# Patient Record
Sex: Female | Born: 1980 | Hispanic: Yes | Marital: Married
Health system: Southern US, Community
[De-identification: ages and names within clinical notes are randomized; demographics above are authoritative.]

## PROBLEM LIST (undated history)

## (undated) DIAGNOSIS — J45909 Unspecified asthma, uncomplicated: Secondary | ICD-10-CM

## (undated) HISTORY — PX: APPENDECTOMY: SHX54

## (undated) HISTORY — PX: TONSILLECTOMY: SUR1361

---

## 2019-04-20 ENCOUNTER — Emergency Department (HOSPITAL_COMMUNITY)
Admission: EM | Admit: 2019-04-20 | Discharge: 2019-04-21 | Disposition: A | Discharge status: Home / Self Care | Payer: Self-pay | Attending: Emergency Medicine | Admitting: Emergency Medicine

## 2019-04-20 ENCOUNTER — Emergency Department (HOSPITAL_COMMUNITY): Payer: Self-pay

## 2019-04-20 ENCOUNTER — Other Ambulatory Visit: Payer: Self-pay

## 2019-04-20 ENCOUNTER — Encounter (HOSPITAL_COMMUNITY): Payer: Self-pay | Admitting: Emergency Medicine

## 2019-04-20 DIAGNOSIS — R609 Edema, unspecified: Secondary | ICD-10-CM | POA: Insufficient documentation

## 2019-04-20 DIAGNOSIS — R0602 Shortness of breath: Secondary | ICD-10-CM | POA: Insufficient documentation

## 2019-04-20 DIAGNOSIS — R002 Palpitations: Secondary | ICD-10-CM | POA: Insufficient documentation

## 2019-04-20 DIAGNOSIS — J45909 Unspecified asthma, uncomplicated: Secondary | ICD-10-CM | POA: Insufficient documentation

## 2019-04-20 HISTORY — DX: Unspecified asthma, uncomplicated: J45.909

## 2019-04-20 LAB — COMPREHENSIVE METABOLIC PANEL
ALT: 24 U/L (ref 0–44)
AST: 22 U/L (ref 15–41)
Albumin: 3.7 g/dL (ref 3.5–5.0)
Alkaline Phosphatase: 83 U/L (ref 38–126)
Anion gap: 6 (ref 5–15)
BUN: 16 mg/dL (ref 6–20)
CO2: 23 mmol/L (ref 22–32)
Calcium: 8.9 mg/dL (ref 8.9–10.3)
Chloride: 110 mmol/L (ref 98–111)
Creatinine, Ser: 0.41 mg/dL — ABNORMAL LOW (ref 0.44–1.00)
GFR calc Af Amer: >60 mL/min (ref >60)
GFR calc non Af Amer: >60 mL/min (ref >60)
Glucose, Bld: 95 mg/dL (ref 70–99)
Potassium: 3.9 mmol/L (ref 3.5–5.1)
Sodium: 139 mmol/L (ref 135–145)
Total Bilirubin: 0.5 mg/dL (ref 0.3–1.2)
Total Protein: 6.7 g/dL (ref 6.5–8.1)

## 2019-04-20 LAB — CBC WITH DIFFERENTIAL/PLATELET
Abs Immature Granulocytes: 0.03 10*3/uL (ref 0.00–0.07)
Basophils Absolute: 0 10*3/uL (ref 0.0–0.1)
Basophils Relative: 0 %
Eosinophils Absolute: 0.1 10*3/uL (ref 0.0–0.5)
Eosinophils Relative: 1 %
HCT: 32.9 % — ABNORMAL LOW (ref 36.0–46.0)
Hemoglobin: 10.3 g/dL — ABNORMAL LOW (ref 12.0–15.0)
Immature Granulocytes: 0 %
Lymphocytes Relative: 23 %
Lymphs Abs: 2.4 10*3/uL (ref 0.7–4.0)
MCH: 26.6 pg (ref 26.0–34.0)
MCHC: 31.3 g/dL (ref 30.0–36.0)
MCV: 85 fL (ref 80.0–100.0)
Monocytes Absolute: 1 10*3/uL (ref 0.1–1.0)
Monocytes Relative: 9 %
Neutro Abs: 7.3 10*3/uL (ref 1.7–7.7)
Neutrophils Relative %: 67 %
Platelets: 242 10*3/uL (ref 150–400)
RBC: 3.87 MIL/uL (ref 3.87–5.11)
RDW: 14.6 % (ref 11.5–15.5)
WBC: 10.8 10*3/uL — ABNORMAL HIGH (ref 4.0–10.5)
nRBC: 0 % (ref 0.0–0.2)

## 2019-04-20 LAB — TSH: TSH: <0.01 u[IU]/mL — ABNORMAL LOW (ref 0.350–4.500)

## 2019-04-20 LAB — BRAIN NATRIURETIC PEPTIDE: B Natriuretic Peptide: 308.6 pg/mL — ABNORMAL HIGH (ref 0.0–100.0)

## 2019-04-20 MED ORDER — SODIUM CHLORIDE 0.9 % IV BOLUS: 1000.0000 mL | Freq: Once | INTRAVENOUS | Status: DC

## 2019-04-20 NOTE — ED Triage Notes (Signed)
Used spanish interpretor Ericka# [id number].  Swelling in legs and ankles, tachycardia and was sent to ED from PCP. Pt reports saw her PC was in [place] over a month ago. Pt also fell 2 days ago and has bruise on left knee.

## 2019-04-20 NOTE — ED Notes (Signed)
Refuses resp nasal swab states had nose surgery 5 yrs ago - and does not want

## 2019-04-20 NOTE — ED Provider Notes (Signed)
[facility]-EMERGENCY DEPT [facility] Emergency Department Provider Note MRN:  [id number]  Arrival date & time: 04/20/19     Chief Complaint   Leg Swelling and Tachycardia   History of Present Illness   Patty Ball is a 39 y.o. year-old female with a history of asthma presenting to the ED with chief complaint of leg swelling and tachycardia.  10 days of leg swelling, palpitations, mild dyspnea on exertion.  Denies chest pain, no headache, no vomiting, no abdominal pain.  No dysuria, no fever, no cough.  Symptoms constant, no exacerbating or alleviating factors.  History obtained using a [place] interpreter.  Review of Systems  A complete 10 system review of systems was obtained and all systems are negative except as noted in the HPI and PMH.   Patient's Health History    Past Medical History:  Diagnosis Date  . Asthma     Past Surgical History:  Procedure Laterality Date  . APPENDECTOMY    . TONSILLECTOMY      No family history on file.  Social History   Socioeconomic History  . Marital status: Married    Spouse name: Not on file  . Number of children: Not on file  . Years of education: Not on file  . Highest education level: Not on file  Occupational History  . Not on file  Tobacco Use  . Smoking status: Not on file  Substance and Sexual Activity  . Alcohol use: Not on file  . Drug use: Not on file  . Sexual activity: Not on file  Other Topics Concern  . Not on file  Social History Narrative  . Not on file   Social Determinants of Health   Financial Resource Strain:   . Difficulty of Paying Living Expenses: Not on file  Food Insecurity:   . Worried About Programme researcher, broadcasting/film/video in the Last Year: Not on file  . Ran Out of Food in the Last Year: Not on file  Transportation Needs:   . Lack of Transportation (Medical): Not on file  . Lack of Transportation (Non-Medical): Not on file  Physical Activity:   . Days of Exercise per Week: Not on file  .  Minutes of Exercise per Session: Not on file  Stress:   . Feeling of Stress : Not on file  Social Connections:   . Frequency of Communication with Friends and Family: Not on file  . Frequency of Social Gatherings with Friends and Family: Not on file  . Attends Religious Services: Not on file  . Active Member of Clubs or Organizations: Not on file  . Attends Banker Meetings: Not on file  . Marital Status: Not on file  Intimate Partner Violence:   . Fear of Current or Ex-Partner: Not on file  . Emotionally Abused: Not on file  . Physically Abused: Not on file  . Sexually Abused: Not on file     Physical Exam  Vital Signs and Nursing Notes reviewed Vitals:   04/20/19 2006 04/20/19 2230  BP: (!) 152/56 (!) 137/58  Pulse: (!) 126 (!) 107  Resp: 18 18  Temp:    SpO2: 99% 100%    CONSTITUTIONAL: Well-appearing, NAD NEURO:  Alert and oriented x 3, no focal deficits EYES:  eyes equal and reactive ENT/NECK:  no LAD, no JVD CARDIO: Tachycardic rate, well-perfused, normal S1 and S2 PULM:  CTAB no wheezing or rhonchi GI/GU:  normal bowel sounds, non-distended, non-tender MSK/SPINE:  No gross deformities, no  edema SKIN:  no rash, atraumatic PSYCH:  Appropriate speech and behavior  Diagnostic and Interventional Summary    EKG Interpretation  Date/Time:    Ventricular Rate:    PR Interval:    QRS Duration:   QT Interval:    QTC Calculation:   R Axis:     Text Interpretation:        Labs Reviewed  CBC WITH DIFFERENTIAL/PLATELET - Abnormal; Notable for the following components:      Result Value   WBC 10.8 (*)    Hemoglobin 10.3 (*)    HCT 32.9 (*)    All other components within normal limits  COMPREHENSIVE METABOLIC PANEL - Abnormal; Notable for the following components:   Creatinine, Ser 0.41 (*)    All other components within normal limits  BRAIN NATRIURETIC PEPTIDE - Abnormal; Notable for the following components:   B Natriuretic Peptide 308.6 (*)     All other components within normal limits  TSH - Abnormal; Notable for the following components:   TSH <0.010 (*)    All other components within normal limits  RESPIRATORY PANEL BY RT PCR (FLU A&B, COVID)  T4, FREE  T3, FREE  THYROTROPIN RECEPTOR AUTOABS  I-STAT BETA HCG BLOOD, ED ([facility], [facility], [facility] ONLY)  TROPONIN I (HIGH SENSITIVITY)    DG Knee Complete 4 Views Left  Final Result    DG Chest 1 View  Final Result    CTA Chest for PE    (Results Pending)    Medications - No data to display   Procedures  /  Critical Care Procedures  ED Course and Medical Decision Making  I have reviewed the triage vital signs, the nursing notes, and pertinent available records from the EMR.  Pertinent labs & imaging results that were available during my care of the patient were reviewed by me and considered in my medical decision making (see below for details).     Considering PE, heart failure, hypothyroidism, COVID-19, work-up pending.  Awaiting labs, CTA chest.  Without PE and with resolution of tachycardia, patient is likely appropriate for discharge.  If labs confirm hyperthyroidism, will consider internal medicine or hospitalist consultation with regard to outpatient medications.  Signed out to oncoming provider at shift change.  Barth Kirks. Sedonia Small, [place] [email or web address]  Final Clinical Impressions(s) / ED Diagnoses     ICD-10-CM   1. Palpitations  R00.2   2. Peripheral edema  R60.9     ED Discharge Orders    None       Discharge Instructions Discussed with and Provided to Patient:   Discharge Instructions   None       Maudie Flakes, MD 04/20/19 2259

## 2019-04-21 ENCOUNTER — Encounter (HOSPITAL_COMMUNITY): Payer: Self-pay

## 2019-04-21 ENCOUNTER — Emergency Department (HOSPITAL_COMMUNITY): Payer: Self-pay

## 2019-04-21 LAB — I-STAT BETA HCG BLOOD, ED (MC, WL, AP ONLY): I-stat hCG, quantitative: <5 m[IU]/mL (ref <5)

## 2019-04-21 LAB — T4, FREE: Free T4: 5.15 ng/dL — ABNORMAL HIGH (ref 0.61–1.12)

## 2019-04-21 LAB — TROPONIN I (HIGH SENSITIVITY): Troponin I (High Sensitivity): 3 ng/L (ref <18)

## 2019-04-21 MED ORDER — IOHEXOL 350 MG/ML SOLN
100.0000 mL | Freq: Once | INTRAVENOUS | Status: AC | PRN
  Administered 2019-04-21: 100 mL via INTRAVENOUS

## 2019-04-21 MED ORDER — SODIUM CHLORIDE (PF) 0.9 % IJ SOLN
INTRAMUSCULAR | Status: AC
  Filled 2019-04-21: qty 50

## 2019-04-21 MED ORDER — METOPROLOL TARTRATE 25 MG PO TABS
25.0000 mg | ORAL_TABLET | Freq: Once | ORAL | Status: AC
  Administered 2019-04-21: 25 mg via ORAL
  Filled 2019-04-21: qty 1

## 2019-04-21 MED ORDER — SODIUM CHLORIDE 0.9 % IV BOLUS (SEPSIS)
2000.0000 mL | Freq: Once | INTRAVENOUS | Status: AC
  Administered 2019-04-21: 2000 mL via INTRAVENOUS

## 2019-04-21 MED ORDER — METOPROLOL TARTRATE 25 MG PO TABS: 25.0000 mg | ORAL_TABLET | Freq: Two times a day (BID) | ORAL | 0 refills | Status: AC

## 2019-04-21 NOTE — ED Provider Notes (Signed)
Interpreter # [phone] Pt reports feeling palpitations a week ago  No active CP No syncope She reports she has been told her "thyroid is low" Will start lopressor Refer to PCP CT chest negative Patient overall well-appearing.  Heart rate is coming down to 120 Refused Covid testing   EKG Interpretation  Date/Time:  Sunday April 21 2019 00:13:37 EST Ventricular Rate:  123 PR Interval:    QRS Duration: 92 QT Interval:  323 QTC Calculation: 462 R Axis:   71 Text Interpretation: Sinus tachycardia Minimal ST depression, lateral leads Confirmed by Zadie Rhine ([id number]) on 04/21/2019 12:21:34 AM      We will start Lopressor Suspect she does have component of hypothyroid causing her symptoms.  I have requested social work to assist with close PCP follow-up as this patient just relocated here from [place] she will likely need treatment for hypothyroid.  At this time no signs of thyroid storm, no acute distress noted She will complete IV fluids and be given Lopressor prior to discharge    Zadie Rhine, MD 04/21/19 [phone]

## 2019-04-22 ENCOUNTER — Telehealth: Payer: Self-pay | Admitting: *Deleted

## 2019-04-22 LAB — T3, FREE: T3, Free: 23.3 pg/mL — ABNORMAL HIGH (ref 2.0–4.4)

## 2019-04-22 NOTE — Telephone Encounter (Signed)
TOC CM contacted pt to speak to her and states she only speaks [place]. Contacted [facility] and scheduled appt for 05/14/2019 at 9:30 am. Office receptionist speaks Spanish and will call and give pt her appt time. Will have Spanish interpreter call to follow up on medications and ED visit. ED provided updated on referral. Isidoro Donning RN CCM, [facility] ED TOC CM [phone]

## 2019-05-14 ENCOUNTER — Inpatient Hospital Stay (INDEPENDENT_AMBULATORY_CARE_PROVIDER_SITE_OTHER): Payer: Self-pay | Admitting: Primary Care

## 2020-09-30 ENCOUNTER — Emergency Department (HOSPITAL_COMMUNITY): Payer: 59

## 2020-09-30 ENCOUNTER — Encounter (HOSPITAL_COMMUNITY): Payer: Self-pay | Admitting: *Deleted

## 2020-09-30 ENCOUNTER — Other Ambulatory Visit: Payer: Self-pay

## 2020-09-30 ENCOUNTER — Emergency Department (HOSPITAL_COMMUNITY)
Admission: EM | Admit: 2020-09-30 | Discharge: 2020-09-30 | Disposition: A | Discharge status: Home / Self Care | Payer: 59 | Attending: Emergency Medicine | Admitting: Emergency Medicine

## 2020-09-30 DIAGNOSIS — R072 Precordial pain: Secondary | ICD-10-CM | POA: Diagnosis not present

## 2020-09-30 DIAGNOSIS — R0602 Shortness of breath: Secondary | ICD-10-CM | POA: Insufficient documentation

## 2020-09-30 DIAGNOSIS — R0789 Other chest pain: Secondary | ICD-10-CM

## 2020-09-30 DIAGNOSIS — N926 Irregular menstruation, unspecified: Secondary | ICD-10-CM | POA: Insufficient documentation

## 2020-09-30 LAB — BASIC METABOLIC PANEL
Anion gap: 8 (ref 5–15)
BUN: 13 mg/dL (ref 6–20)
CO2: 27 mmol/L (ref 22–32)
Calcium: 9.5 mg/dL (ref 8.9–10.3)
Chloride: 103 mmol/L (ref 98–111)
Creatinine, Ser: 0.69 mg/dL (ref 0.44–1.00)
GFR, Estimated: >60 mL/min (ref >60)
Glucose, Bld: 86 mg/dL (ref 70–99)
Potassium: 3.7 mmol/L (ref 3.5–5.1)
Sodium: 138 mmol/L (ref 135–145)

## 2020-09-30 LAB — CBC WITH DIFFERENTIAL/PLATELET
Abs Immature Granulocytes: 0.02 10*3/uL (ref 0.00–0.07)
Basophils Absolute: 0 10*3/uL (ref 0.0–0.1)
Basophils Relative: 0 %
Eosinophils Absolute: 0.1 10*3/uL (ref 0.0–0.5)
Eosinophils Relative: 1 %
HCT: 39.6 % (ref 36.0–46.0)
Hemoglobin: 12.9 g/dL (ref 12.0–15.0)
Immature Granulocytes: 0 %
Lymphocytes Relative: 24 %
Lymphs Abs: 2.2 10*3/uL (ref 0.7–4.0)
MCH: 28 pg (ref 26.0–34.0)
MCHC: 32.6 g/dL (ref 30.0–36.0)
MCV: 85.9 fL (ref 80.0–100.0)
Monocytes Absolute: 0.8 10*3/uL (ref 0.1–1.0)
Monocytes Relative: 9 %
Neutro Abs: 5.8 10*3/uL (ref 1.7–7.7)
Neutrophils Relative %: 66 %
Platelets: 323 10*3/uL (ref 150–400)
RBC: 4.61 MIL/uL (ref 3.87–5.11)
RDW: 15.2 % (ref 11.5–15.5)
WBC: 8.9 10*3/uL (ref 4.0–10.5)
nRBC: 0 % (ref 0.0–0.2)

## 2020-09-30 LAB — TYPE AND SCREEN
ABO/RH(D): A POS
Antibody Screen: NEGATIVE

## 2020-09-30 LAB — TROPONIN I (HIGH SENSITIVITY)
Troponin I (High Sensitivity): <2 ng/L (ref <18)
Troponin I (High Sensitivity): <2 ng/L (ref <18)

## 2020-09-30 LAB — I-STAT BETA HCG BLOOD, ED (MC, WL, AP ONLY): I-stat hCG, quantitative: <5 m[IU]/mL (ref <5)

## 2020-09-30 LAB — PROTIME-INR
INR: 1 (ref 0.8–1.2)
Prothrombin Time: 13.5 seconds (ref 11.4–15.2)

## 2020-09-30 NOTE — ED Triage Notes (Signed)
Pt speaks spanish, interpreter used. Pt complains of dizziness back pain, chest pain and shortness of breath. She states she has been on her period for the past 20 days. She reports bleeding is heavy.

## 2020-09-30 NOTE — Discharge Instructions (Addendum)
Return for any problem.  Follow up closely with your regular care provider in [place] when you return home.

## 2020-09-30 NOTE — ED Provider Notes (Signed)
[facility]  HOSPITAL-EMERGENCY DEPT Provider Note   CSN: [id number] Arrival date & time: 09/30/20  6644     History Chief Complaint  Patient presents with   Chest Pain   Shortness of Breath    Patty Ball is a 40 y.o. female.  40 year old female with prior medical history as detailed below presents for evaluation.  Patient reports onset of chest discomfort this morning.  Lasted approximately 30 minutes.  Patient's describes the discomfort as pressure across her entire upper chest.  She also complains of persistent vaginal bleeding ongoing for the last 3 weeks.  She reports that her typical period last approximately 3 days.  Chest discomfort is now resolved.   She is visiting here from [place].  She plans on returning home on July 8.  She denies prior history of cardiac disease.  She denies recent fever or other complaint.  The history is provided by the patient and medical records. A language interpreter was used.  Chest Pain Pain location:  Substernal area Pain quality: aching   Pain radiates to:  Does not radiate Pain severity:  No pain Onset quality:  Sudden Duration:  30 minutes Timing:  Rare Progression:  Resolved Chronicity:  New Relieved by:  Nothing Associated symptoms: shortness of breath   Shortness of Breath Associated symptoms: chest pain       History reviewed. No pertinent past medical history.  There are no problems to display for this patient.   Past Surgical History:  Procedure Laterality Date   APPENDECTOMY       OB History   No obstetric history on file.     No family history on file.     Home Medications Prior to Admission medications   Not on File    Allergies    Hydrocortisone, Penicillins, and Vitamin k and related  Review of Systems   Review of Systems  Respiratory:  Positive for shortness of breath.   Cardiovascular:  Positive for chest pain.   Physical Exam Updated Vital Signs BP 131/70   Pulse  63   Temp 98.1 F (36.7 C) (Oral)   Resp 18   LMP 09/09/2020   SpO2 97%   Physical Exam Vitals and nursing note reviewed.  Constitutional:      General: She is not in acute distress.    Appearance: Normal appearance. She is well-developed.  HENT:     Head: Normocephalic and atraumatic.  Eyes:     Conjunctiva/sclera: Conjunctivae normal.     Pupils: Pupils are equal, round, and reactive to light.  Cardiovascular:     Rate and Rhythm: Normal rate and regular rhythm.     Heart sounds: Normal heart sounds.  Pulmonary:     Effort: Pulmonary effort is normal. No respiratory distress.     Breath sounds: Normal breath sounds.  Abdominal:     General: There is no distension.     Palpations: Abdomen is soft.     Tenderness: There is no abdominal tenderness.  Musculoskeletal:        General: No deformity. Normal range of motion.     Cervical back: Normal range of motion and neck supple.  Skin:    General: Skin is warm and dry.  Neurological:     General: No focal deficit present.     Mental Status: She is alert and oriented to person, place, and time.    ED Results / Procedures / Treatments   Labs (all labs ordered are listed, but  only abnormal results are displayed) Labs Reviewed  PROTIME-INR  BASIC METABOLIC PANEL  CBC WITH DIFFERENTIAL/PLATELET  I-STAT BETA HCG BLOOD, ED ([facility], [facility], [facility] ONLY)  TYPE AND SCREEN  ABO/RH  TROPONIN I (HIGH SENSITIVITY)  TROPONIN I (HIGH SENSITIVITY)    EKG EKG Interpretation  Date/Time:  Wednesday September 30 2020 10:06:47 EDT Ventricular Rate:  55 PR Interval:  146 QRS Duration: 102 QT Interval:  433 QTC Calculation: 415 R Axis:   73 Text Interpretation: Sinus rhythm RSR' in V1 or V2, probably normal variant Confirmed by Kristine Royal [phone]) on 09/30/2020 12:22:31 PM  Radiology DG Chest 2 View  Result Date: 09/30/2020 CLINICAL DATA:  Chest pain EXAM: CHEST - 2 VIEW COMPARISON:  None. FINDINGS: Artifact from EKG leads. Normal heart size  and mediastinal contours. No acute infiltrate or edema. No effusion or pneumothorax. No acute osseous findings. IMPRESSION: No active cardiopulmonary disease. Electronically Signed   By: Marnee Spring M.D.   On: 09/30/2020 09:35    Procedures Procedures   Medications Ordered in ED Medications - No data to display  ED Course  I have reviewed the triage vital signs and the nursing notes.  Pertinent labs & imaging results that were available during my care of the patient were reviewed by me and considered in my medical decision making (see chart for details).    MDM Rules/Calculators/A&P                          MDM  MSE complete  Patty Ball was evaluated in Emergency Department on 09/30/2020 for the symptoms described in the history of present illness. She was evaluated in the context of the global COVID-19 pandemic, which necessitated consideration that the patient might be at risk for infection with the SARS-CoV-2 virus that causes COVID-19. Institutional protocols and algorithms that pertain to the evaluation of patients at risk for COVID-19 are in a state of rapid change based on information released by regulatory bodies including the CDC and federal and state organizations. These policies and algorithms were followed during the patient's care in the ED.  Patient presents for evaluation of atypical chest pain.   EKG without acute ischemia.  Troponin times two checks is undetectable.  Patient is low risk for ACS.  Also, patient without significant anemia.   She is improved after her ED evaluation and now desires DC.   Interpreter used for discharge instructions.  Importance of close follow-up is stressed repeatedly when patient returns home.  Strict return precautions given and understood.     Final Clinical Impression(s) / ED Diagnoses Final diagnoses:  Irregular menstruation  Atypical chest pain    Rx / DC Orders ED Discharge Orders     None         Wynetta Fines, MD 09/30/20 1330

## 2020-10-13 ENCOUNTER — Emergency Department (HOSPITAL_COMMUNITY): Payer: 59

## 2020-10-13 ENCOUNTER — Other Ambulatory Visit: Payer: Self-pay

## 2020-10-13 ENCOUNTER — Emergency Department (HOSPITAL_COMMUNITY)
Admission: EM | Admit: 2020-10-13 | Discharge: 2020-10-13 | Disposition: A | Discharge status: Home / Self Care | Payer: 59 | Attending: Emergency Medicine | Admitting: Emergency Medicine

## 2020-10-13 ENCOUNTER — Encounter (HOSPITAL_COMMUNITY): Payer: Self-pay

## 2020-10-13 DIAGNOSIS — R1010 Upper abdominal pain, unspecified: Secondary | ICD-10-CM | POA: Diagnosis present

## 2020-10-13 DIAGNOSIS — J45909 Unspecified asthma, uncomplicated: Secondary | ICD-10-CM | POA: Diagnosis not present

## 2020-10-13 DIAGNOSIS — R1013 Epigastric pain: Secondary | ICD-10-CM | POA: Diagnosis not present

## 2020-10-13 LAB — CBC WITH DIFFERENTIAL/PLATELET
Abs Immature Granulocytes: 0.04 10*3/uL (ref 0.00–0.07)
Basophils Absolute: 0 10*3/uL (ref 0.0–0.1)
Basophils Relative: 0 %
Eosinophils Absolute: 0.1 10*3/uL (ref 0.0–0.5)
Eosinophils Relative: 1 %
HCT: 38 % (ref 36.0–46.0)
Hemoglobin: 12.5 g/dL (ref 12.0–15.0)
Immature Granulocytes: 0 %
Lymphocytes Relative: 23 %
Lymphs Abs: 2.5 10*3/uL (ref 0.7–4.0)
MCH: 28.1 pg (ref 26.0–34.0)
MCHC: 32.9 g/dL (ref 30.0–36.0)
MCV: 85.4 fL (ref 80.0–100.0)
Monocytes Absolute: 1 10*3/uL (ref 0.1–1.0)
Monocytes Relative: 9 %
Neutro Abs: 7.3 10*3/uL (ref 1.7–7.7)
Neutrophils Relative %: 67 %
Platelets: 322 10*3/uL (ref 150–400)
RBC: 4.45 MIL/uL (ref 3.87–5.11)
RDW: 15.5 % (ref 11.5–15.5)
WBC: 11 10*3/uL — ABNORMAL HIGH (ref 4.0–10.5)
nRBC: 0 % (ref 0.0–0.2)

## 2020-10-13 LAB — URINALYSIS, ROUTINE W REFLEX MICROSCOPIC
Bacteria, UA: NONE SEEN
Bilirubin Urine: NEGATIVE
Glucose, UA: NEGATIVE mg/dL
Ketones, ur: NEGATIVE mg/dL
Leukocytes,Ua: NEGATIVE
Nitrite: NEGATIVE
Protein, ur: NEGATIVE mg/dL
Specific Gravity, Urine: 1.006 (ref 1.005–1.030)
pH: 5 (ref 5.0–8.0)

## 2020-10-13 LAB — COMPREHENSIVE METABOLIC PANEL
ALT: 24 U/L (ref 0–44)
AST: 23 U/L (ref 15–41)
Albumin: 4.2 g/dL (ref 3.5–5.0)
Alkaline Phosphatase: 60 U/L (ref 38–126)
Anion gap: 9 (ref 5–15)
BUN: 24 mg/dL — ABNORMAL HIGH (ref 6–20)
CO2: 25 mmol/L (ref 22–32)
Calcium: 9.3 mg/dL (ref 8.9–10.3)
Chloride: 103 mmol/L (ref 98–111)
Creatinine, Ser: 0.72 mg/dL (ref 0.44–1.00)
GFR, Estimated: >60 mL/min (ref >60)
Glucose, Bld: 124 mg/dL — ABNORMAL HIGH (ref 70–99)
Potassium: 4.2 mmol/L (ref 3.5–5.1)
Sodium: 137 mmol/L (ref 135–145)
Total Bilirubin: 0.3 mg/dL (ref 0.3–1.2)
Total Protein: 8.1 g/dL (ref 6.5–8.1)

## 2020-10-13 LAB — PREGNANCY, URINE: Preg Test, Ur: NEGATIVE

## 2020-10-13 LAB — LIPASE, BLOOD: Lipase: 29 U/L (ref 11–51)

## 2020-10-13 MED ORDER — FAMOTIDINE 20 MG PO TABS: 20.0000 mg | ORAL_TABLET | Freq: Two times a day (BID) | ORAL | 0 refills | Status: AC

## 2020-10-13 MED ORDER — SODIUM CHLORIDE (PF) 0.9 % IJ SOLN
INTRAMUSCULAR | Status: AC
  Filled 2020-10-13: qty 50

## 2020-10-13 MED ORDER — ONDANSETRON HCL 4 MG/2ML IJ SOLN
4.0000 mg | Freq: Once | INTRAMUSCULAR | Status: AC
  Administered 2020-10-13: 4 mg via INTRAVENOUS
  Filled 2020-10-13: qty 2

## 2020-10-13 MED ORDER — MORPHINE SULFATE (PF) 4 MG/ML IV SOLN
4.0000 mg | Freq: Once | INTRAVENOUS | Status: AC
Start: 2020-10-13 — End: 2020-10-13
  Administered 2020-10-13: 4 mg via INTRAVENOUS
  Filled 2020-10-13: qty 1

## 2020-10-13 MED ORDER — DICYCLOMINE HCL 20 MG PO TABS: 20.0000 mg | ORAL_TABLET | Freq: Three times a day (TID) | ORAL | 0 refills | Status: AC

## 2020-10-13 MED ORDER — IOHEXOL 300 MG/ML  SOLN
100.0000 mL | Freq: Once | INTRAMUSCULAR | Status: AC | PRN
  Administered 2020-10-13: 100 mL via INTRAVENOUS

## 2020-10-13 NOTE — ED Provider Notes (Signed)
[facility]-EMERGENCY DEPT Provider Note   CSN: [id number] Arrival date & time: 10/13/20  0115     History Chief Complaint  Patient presents with   Abdominal Pain   Chest Pain   Back Pain    Patty Ball is a 40 y.o. female.  Patient presents to the emergency department for evaluation of abdominal pain.  Patient has been experiencing intermittent episodes of upper abdominal pain, under the ribs bilaterally.  Pain has been on and off for weeks.  She has not identified anything that causes the pain.  No associated fever, nausea, vomiting or diarrhea.      Past Medical History:  Diagnosis Date   Asthma     There are no problems to display for this patient.   Past Surgical History:  Procedure Laterality Date   APPENDECTOMY     TONSILLECTOMY       OB History   No obstetric history on file.     History reviewed. No pertinent family history.     Home Medications Prior to Admission medications   Medication Sig Start Date End Date Taking? Authorizing Provider  metoprolol tartrate (LOPRESSOR) 25 MG tablet Take 1 tablet (25 mg total) by mouth 2 (two) times daily. 04/21/19   Zadie Rhine, MD    Allergies    Hydrocortisone, Penicillins, Penicillins, and Vitamin k and related  Review of Systems   Review of Systems  Gastrointestinal:  Positive for abdominal pain.  All other systems reviewed and are negative.  Physical Exam Updated Vital Signs BP (!) 159/75   Pulse 80   Temp 98.2 F (36.8 C) (Oral)   Resp 20   Ht 5\' 8"  (1.727 m)   Wt 104.3 kg   SpO2 98%   BMI 34.97 kg/m   Physical Exam Vitals and nursing note reviewed.  Constitutional:      General: She is not in acute distress.    Appearance: Normal appearance. She is well-developed.  HENT:     Head: Normocephalic and atraumatic.     Right Ear: Hearing normal.     Left Ear: Hearing normal.     Nose: Nose normal.  Eyes:     Conjunctiva/sclera: Conjunctivae normal.      Pupils: Pupils are equal, round, and reactive to light.  Cardiovascular:     Rate and Rhythm: Regular rhythm.     Heart sounds: S1 normal and S2 normal. No murmur heard.   No friction rub. No gallop.  Pulmonary:     Effort: Pulmonary effort is normal. No respiratory distress.     Breath sounds: Normal breath sounds.  Chest:     Chest wall: No tenderness.  Abdominal:     General: Bowel sounds are normal.     Palpations: Abdomen is soft.     Tenderness: There is abdominal tenderness in the right upper quadrant. There is guarding. There is no rebound. Negative signs include McBurney's sign.     Hernia: No hernia is present.  Musculoskeletal:        General: Normal range of motion.     Cervical back: Normal range of motion and neck supple.  Skin:    General: Skin is warm and dry.     Findings: No rash.  Neurological:     Mental Status: She is alert and oriented to person, place, and time.     GCS: GCS eye subscore is 4. GCS verbal subscore is 5. GCS motor subscore is 6.  Cranial Nerves: No cranial nerve deficit.     Sensory: No sensory deficit.     Coordination: Coordination normal.  Psychiatric:        Speech: Speech normal.        Behavior: Behavior normal.        Thought Content: Thought content normal.    ED Results / Procedures / Treatments   Labs (all labs ordered are listed, but only abnormal results are displayed) Labs Reviewed  CBC WITH DIFFERENTIAL/PLATELET - Abnormal; Notable for the following components:      Result Value   WBC 11.0 (*)    All other components within normal limits  COMPREHENSIVE METABOLIC PANEL - Abnormal; Notable for the following components:   Glucose, Bld 124 (*)    BUN 24 (*)    All other components within normal limits  URINALYSIS, ROUTINE W REFLEX MICROSCOPIC - Abnormal; Notable for the following components:   Color, Urine COLORLESS (*)    Hgb urine dipstick SMALL (*)    All other components within normal limits  LIPASE, BLOOD   PREGNANCY, URINE    EKG EKG Interpretation  Date/Time:  Tuesday October 13 2020 01:36:51 EDT Ventricular Rate:  96 PR Interval:  150 QRS Duration: 88 QT Interval:  356 QTC Calculation: 450 R Axis:   107 Text Interpretation: Right and left arm electrode reversal, interpretation assumes no reversal Sinus rhythm Probable lateral infarct, old Confirmed by Gilda Crease [phone]) on 10/13/2020 2:25:47 AM  Radiology CT ABDOMEN PELVIS W CONTRAST  Result Date: 10/13/2020 CLINICAL DATA:  40 year old female with history of acute onset of nonlocalized abdominal pain. Elevated white blood cell count. Microscopic hematuria. EXAM: CT ABDOMEN AND PELVIS WITH CONTRAST TECHNIQUE: Multidetector CT imaging of the abdomen and pelvis was performed using the standard protocol following bolus administration of intravenous contrast. CONTRAST:  OMNIPAQUE IOHEXOL 300 MG/ML  SOLN COMPARISON:  No priors. FINDINGS: Lower chest: Unremarkable. Hepatobiliary: Diffuse low attenuation throughout the hepatic parenchyma, indicative of a background of hepatic steatosis. No discrete cystic or solid hepatic lesions. No intra or extrahepatic biliary ductal dilatation. Gallbladder is normal in appearance. Pancreas: No pancreatic mass. No pancreatic ductal dilatation. No pancreatic or peripancreatic fluid collections or inflammatory changes. Spleen: Unremarkable. Adrenals/Urinary Tract: Bilateral kidneys and adrenal glands are normal in appearance. No hydroureteronephrosis. Urinary bladder is nearly decompressed, but otherwise unremarkable in appearance. Stomach/Bowel: The appearance of the stomach is normal. There is no pathologic dilatation of small bowel or colon. A few scattered colonic diverticulae are noted, without surrounding inflammatory changes to suggest an acute diverticulitis at this time. Normal appendix. Vascular/Lymphatic: No significant atherosclerotic disease, aneurysm or dissection noted in the abdominal or  pelvic vasculature. No lymphadenopathy noted in the abdomen or pelvis. Reproductive: Uterus and ovaries are unremarkable in appearance. Other: No significant volume of ascites.  No pneumoperitoneum. Musculoskeletal: There are no aggressive appearing lytic or blastic lesions noted in the visualized portions of the skeleton. IMPRESSION: 1. No acute findings are noted in the abdomen or pelvis. 2. Hepatic steatosis. 3. Mild colonic diverticulosis without evidence of acute diverticulitis at this time. Electronically Signed   By: Trudie Reed M.D.   On: 10/13/2020 06:08   US ABDOMEN LIMITED RUQ (LIVER/GB)  Result Date: 10/13/2020 CLINICAL DATA:  Abdominal pain EXAM: ULTRASOUND ABDOMEN LIMITED RIGHT UPPER QUADRANT COMPARISON:  None. FINDINGS: Gallbladder: No gallstones or wall thickening visualized. No sonographic Murphy sign noted by sonographer. Common bile duct: Diameter: 3 mm Liver: Echogenic liver with pericholecystic sparing. Portal vein  is patent on color Doppler imaging with normal direction of blood flow towards the liver. IMPRESSION: 1. Hepatic steatosis. 2. Negative gallbladder Electronically Signed   By: Marnee Spring M.D.   On: 10/13/2020 04:12    Procedures Procedures   Medications Ordered in ED Medications  sodium chloride (PF) 0.9 % injection (has no administration in time range)  morphine 4 MG/ML injection 4 mg (4 mg Intravenous Given 10/13/20 0427)  ondansetron (ZOFRAN) injection 4 mg (4 mg Intravenous Given 10/13/20 0428)  iohexol (OMNIPAQUE) 300 MG/ML solution 100 mL (100 mLs Intravenous Contrast Given 10/13/20 0537)    ED Course  I have reviewed the triage vital signs and the nursing notes.  Pertinent labs & imaging results that were available during my care of the patient were reviewed by me and considered in my medical decision making (see chart for details).    MDM Rules/Calculators/A&P                          Patient presents to the emergency department for evaluation of  abdominal pain.  Patient has been experiencing pain in the upper abdomen that radiates into the back.  Pain seems to follow the costal margin bilaterally.  Examination revealed epigastric and right upper quadrant tenderness.  Lab work was unremarkable.  Patient underwent right upper quadrant ultrasound which was negative.  No evidence of gallbladder disease.  She then underwent CT scan to further evaluate.  No acute findings to explain her pain.  Will attempt symptomatic treatment, no further work-up necessary at this time.  Final Clinical Impression(s) / ED Diagnoses Final diagnoses:  Epigastric pain    Rx / DC Orders ED Discharge Orders     None        Shalaunda Weatherholtz, Canary Brim, MD 10/13/20 [phone]

## 2020-10-13 NOTE — ED Notes (Signed)
Pt ambulated to bathroom with no assistance.  

## 2020-10-13 NOTE — ED Triage Notes (Signed)
Pt is spanish speaking, family at bedside. Pt complains of pain to her chest, back , and upper abdominal area. Pt has had this pain for weeks.

## 2020-10-14 ENCOUNTER — Encounter (HOSPITAL_COMMUNITY): Payer: Self-pay | Admitting: Emergency Medicine

## 2020-10-14 ENCOUNTER — Emergency Department (HOSPITAL_COMMUNITY)
Admission: EM | Admit: 2020-10-14 | Discharge: 2020-10-14 | Disposition: A | Discharge status: Home / Self Care | Payer: 59 | Source: Home / Self Care | Attending: Emergency Medicine | Admitting: Emergency Medicine

## 2020-10-14 ENCOUNTER — Encounter (HOSPITAL_COMMUNITY): Payer: Self-pay

## 2020-10-14 ENCOUNTER — Emergency Department (HOSPITAL_COMMUNITY): Payer: 59

## 2020-10-14 ENCOUNTER — Emergency Department (HOSPITAL_COMMUNITY)
Admission: EM | Admit: 2020-10-14 | Discharge: 2020-10-14 | Disposition: A | Discharge status: Home / Self Care | Payer: 59 | Attending: Emergency Medicine | Admitting: Emergency Medicine

## 2020-10-14 ENCOUNTER — Other Ambulatory Visit: Payer: Self-pay

## 2020-10-14 DIAGNOSIS — R0602 Shortness of breath: Secondary | ICD-10-CM | POA: Insufficient documentation

## 2020-10-14 DIAGNOSIS — J45909 Unspecified asthma, uncomplicated: Secondary | ICD-10-CM | POA: Insufficient documentation

## 2020-10-14 DIAGNOSIS — R1013 Epigastric pain: Secondary | ICD-10-CM | POA: Insufficient documentation

## 2020-10-14 DIAGNOSIS — R079 Chest pain, unspecified: Secondary | ICD-10-CM | POA: Diagnosis not present

## 2020-10-14 DIAGNOSIS — R002 Palpitations: Secondary | ICD-10-CM | POA: Insufficient documentation

## 2020-10-14 LAB — COMPREHENSIVE METABOLIC PANEL
ALT: 24 U/L (ref 0–44)
AST: 16 U/L (ref 15–41)
Albumin: 4.8 g/dL (ref 3.5–5.0)
Alkaline Phosphatase: 69 U/L (ref 38–126)
Anion gap: 9 (ref 5–15)
BUN: 21 mg/dL — ABNORMAL HIGH (ref 6–20)
CO2: 26 mmol/L (ref 22–32)
Calcium: 9.2 mg/dL (ref 8.9–10.3)
Chloride: 102 mmol/L (ref 98–111)
Creatinine, Ser: 0.74 mg/dL (ref 0.44–1.00)
GFR, Estimated: >60 mL/min (ref >60)
Glucose, Bld: 112 mg/dL — ABNORMAL HIGH (ref 70–99)
Potassium: 4 mmol/L (ref 3.5–5.1)
Sodium: 137 mmol/L (ref 135–145)
Total Bilirubin: 0.3 mg/dL (ref 0.3–1.2)
Total Protein: 8.4 g/dL — ABNORMAL HIGH (ref 6.5–8.1)

## 2020-10-14 LAB — CBC
HCT: 40.2 % (ref 36.0–46.0)
Hemoglobin: 13.3 g/dL (ref 12.0–15.0)
MCH: 28.2 pg (ref 26.0–34.0)
MCHC: 33.1 g/dL (ref 30.0–36.0)
MCV: 85.4 fL (ref 80.0–100.0)
Platelets: 321 10*3/uL (ref 150–400)
RBC: 4.71 MIL/uL (ref 3.87–5.11)
RDW: 15.5 % (ref 11.5–15.5)
WBC: 10.8 10*3/uL — ABNORMAL HIGH (ref 4.0–10.5)
nRBC: 0 % (ref 0.0–0.2)

## 2020-10-14 LAB — D-DIMER, QUANTITATIVE: D-Dimer, Quant: 0.31 ug/mL-FEU (ref 0.00–0.50)

## 2020-10-14 LAB — TROPONIN I (HIGH SENSITIVITY): Troponin I (High Sensitivity): <2 ng/L (ref <18)

## 2020-10-14 LAB — TSH: TSH: 4.275 u[IU]/mL (ref 0.350–4.500)

## 2020-10-14 LAB — LIPASE, BLOOD: Lipase: 27 U/L (ref 11–51)

## 2020-10-14 MED ORDER — ONDANSETRON 4 MG PO TBDP
8.0000 mg | ORAL_TABLET | Freq: Once | ORAL | Status: AC
  Administered 2020-10-14: 8 mg via ORAL
  Filled 2020-10-14: qty 2

## 2020-10-14 MED ORDER — DICYCLOMINE HCL 10 MG/ML IM SOLN
20.0000 mg | Freq: Once | INTRAMUSCULAR | Status: AC
  Administered 2020-10-14: 20 mg via INTRAMUSCULAR
  Filled 2020-10-14: qty 2

## 2020-10-14 MED ORDER — SUCRALFATE 1 G PO TABS: 1.0000 g | ORAL_TABLET | Freq: Three times a day (TID) | ORAL | 0 refills | Status: AC

## 2020-10-14 MED ORDER — DICYCLOMINE HCL 10 MG PO CAPS
10.0000 mg | ORAL_CAPSULE | Freq: Once | ORAL | Status: AC
  Administered 2020-10-14: 10 mg via ORAL
  Filled 2020-10-14: qty 1

## 2020-10-14 MED ORDER — FENTANYL CITRATE (PF) 100 MCG/2ML IJ SOLN: 50.0000 ug | Freq: Once | INTRAMUSCULAR | Status: DC

## 2020-10-14 MED ORDER — LIDOCAINE VISCOUS HCL 2 % MT SOLN
15.0000 mL | Freq: Once | OROMUCOSAL | Status: AC
  Administered 2020-10-14: 15 mL via ORAL
  Filled 2020-10-14: qty 15

## 2020-10-14 MED ORDER — PANTOPRAZOLE SODIUM 40 MG IV SOLR
40.0000 mg | Freq: Once | INTRAVENOUS | Status: AC
  Administered 2020-10-14: 40 mg via INTRAVENOUS
  Filled 2020-10-14: qty 40

## 2020-10-14 MED ORDER — OMEPRAZOLE 20 MG PO CPDR: 20.0000 mg | DELAYED_RELEASE_CAPSULE | Freq: Every day | ORAL | 0 refills | Status: AC

## 2020-10-14 MED ORDER — ALUM & MAG HYDROXIDE-SIMETH 200-200-20 MG/5ML PO SUSP
30.0000 mL | Freq: Once | ORAL | Status: AC
  Administered 2020-10-14: 30 mL via ORAL
  Filled 2020-10-14: qty 30

## 2020-10-14 MED ORDER — FENTANYL CITRATE (PF) 100 MCG/2ML IJ SOLN
50.0000 ug | Freq: Once | INTRAMUSCULAR | Status: AC
  Administered 2020-10-14: 50 ug via INTRAMUSCULAR
  Filled 2020-10-14: qty 2

## 2020-10-14 NOTE — ED Provider Notes (Signed)
[facility] EMERGENCY DEPARTMENT Provider Note   CSN: [id number] Arrival date & time: 10/14/20  1129     History No chief complaint on file.   Patty Ball Janeece Fitting is a 40 y.o. female.  HPI  Patient presents with multiple complaints, chief of which is epigastric pain.  Pain has been gradual, persistent and worsening.  Pain has been intermittent off and on for weeks and is worse under her ribs.  The GI cocktail was given to the ED have alleviated the pain, she does not know any aggravating factors.  She has been seen twice in the last 48 hours for the same complaint.    She is originally from [place] and is heading home on Friday via airplane.  Past Medical History:  Diagnosis Date   Asthma     There are no problems to display for this patient.   Past Surgical History:  Procedure Laterality Date   APPENDECTOMY     TONSILLECTOMY       OB History   No obstetric history on file.     No family history on file.  Social History   Tobacco Use   Smoking status: Unknown    Home Medications Prior to Admission medications   Medication Sig Start Date End Date Taking? Authorizing Provider  dicyclomine (BENTYL) 20 MG tablet Take 1 tablet (20 mg total) by mouth 3 (three) times daily before meals. 10/13/20   Gilda Crease, MD  famotidine (PEPCID) 20 MG tablet Take 1 tablet (20 mg total) by mouth 2 (two) times daily. 10/13/20   Gilda Crease, MD  metoprolol tartrate (LOPRESSOR) 25 MG tablet Take 1 tablet (25 mg total) by mouth 2 (two) times daily. 04/21/19   Zadie Rhine, MD  omeprazole (PRILOSEC) 20 MG capsule Take 1 capsule (20 mg total) by mouth daily. 10/14/20   Renne Crigler, PA-C  sucralfate (CARAFATE) 1 g tablet Take 1 tablet (1 g total) by mouth 4 (four) times daily -  with meals and at bedtime. 10/14/20   Renne Crigler, PA-C    Allergies    Hydrocortisone, Penicillins, Penicillins, and Vitamin k and related  Review of Systems    Review of Systems  Constitutional:  Positive for appetite change.  Cardiovascular:  Positive for chest pain.  Gastrointestinal:  Positive for abdominal pain. Negative for nausea and vomiting.   Physical Exam Updated Vital Signs BP 126/81 (BP Location: Left Arm)   Pulse 74   Temp 98.8 F (37.1 C) (Oral)   Resp 17   SpO2 100%   Physical Exam Vitals and nursing note reviewed. Exam conducted with a chaperone present.  Constitutional:      General: She is not in acute distress.    Appearance: Normal appearance.  HENT:     Head: Normocephalic and atraumatic.  Eyes:     General: No scleral icterus.    Extraocular Movements: Extraocular movements intact.     Pupils: Pupils are equal, round, and reactive to light.  Abdominal:     General: Abdomen is flat. There is no distension.     Tenderness: There is abdominal tenderness. There is no guarding or rebound.     Comments: Tenderness to the epigastric region.   Skin:    Coloration: Skin is not jaundiced.  Neurological:     Mental Status: She is alert. Mental status is at baseline.     Coordination: Coordination normal.    ED Results / Procedures / Treatments   Labs (all  labs ordered are listed, but only abnormal results are displayed) Labs Reviewed - No data to display  EKG EKG Interpretation  Date/Time:  Wednesday October 14 2020 11:52:44 EDT Ventricular Rate:  86 PR Interval:  146 QRS Duration: 88 QT Interval:  388 QTC Calculation: 464 R Axis:   71 Text Interpretation: Normal sinus rhythm Nonspecific ST abnormality Abnormal ECG NSR, similar to previous Confirmed by Coralee Pesa [phone]) on 10/14/2020 4:49:05 PM  Radiology DG Chest 2 View  Result Date: 10/14/2020 CLINICAL DATA:  Provided history: Epigastric pain. EXAM: CHEST - 2 VIEW COMPARISON:  Radiographs of the chest and abdomen performed earlier today 10/14/2020. FINDINGS: A metallic clothes pin overlies the chest. Heart size within normal limits. No appreciable  airspace consolidation. No evidence of pleural effusion or pneumothorax. No acute bony abnormality identified. Mild thoracic dextrocurvature. IMPRESSION: No evidence of active cardiopulmonary disease. Electronically Signed   By: Jackey Loge DO   On: 10/14/2020 13:04   CT ABDOMEN PELVIS W CONTRAST  Result Date: 10/13/2020 CLINICAL DATA:  40 year old female with history of acute onset of nonlocalized abdominal pain. Elevated white blood cell count. Microscopic hematuria. EXAM: CT ABDOMEN AND PELVIS WITH CONTRAST TECHNIQUE: Multidetector CT imaging of the abdomen and pelvis was performed using the standard protocol following bolus administration of intravenous contrast. CONTRAST:  OMNIPAQUE IOHEXOL 300 MG/ML  SOLN COMPARISON:  No priors. FINDINGS: Lower chest: Unremarkable. Hepatobiliary: Diffuse low attenuation throughout the hepatic parenchyma, indicative of a background of hepatic steatosis. No discrete cystic or solid hepatic lesions. No intra or extrahepatic biliary ductal dilatation. Gallbladder is normal in appearance. Pancreas: No pancreatic mass. No pancreatic ductal dilatation. No pancreatic or peripancreatic fluid collections or inflammatory changes. Spleen: Unremarkable. Adrenals/Urinary Tract: Bilateral kidneys and adrenal glands are normal in appearance. No hydroureteronephrosis. Urinary bladder is nearly decompressed, but otherwise unremarkable in appearance. Stomach/Bowel: The appearance of the stomach is normal. There is no pathologic dilatation of small bowel or colon. A few scattered colonic diverticulae are noted, without surrounding inflammatory changes to suggest an acute diverticulitis at this time. Normal appendix. Vascular/Lymphatic: No significant atherosclerotic disease, aneurysm or dissection noted in the abdominal or pelvic vasculature. No lymphadenopathy noted in the abdomen or pelvis. Reproductive: Uterus and ovaries are unremarkable in appearance. Other: No significant volume  of ascites.  No pneumoperitoneum. Musculoskeletal: There are no aggressive appearing lytic or blastic lesions noted in the visualized portions of the skeleton. IMPRESSION: 1. No acute findings are noted in the abdomen or pelvis. 2. Hepatic steatosis. 3. Mild colonic diverticulosis without evidence of acute diverticulitis at this time. Electronically Signed   By: Trudie Reed M.D.   On: 10/13/2020 06:08   DG Abdomen Acute W/Chest  Result Date: 10/14/2020 CLINICAL DATA:  40 year old female with history of chest pain and shortness of breath intermittently for the past several weeks. EXAM: DG ABDOMEN ACUTE WITH 1 VIEW CHEST COMPARISON:  Chest x-ray 04/20/2019. FINDINGS: Lung volumes are normal. No consolidative airspace disease. No pleural effusions. No pneumothorax. No pulmonary nodule or mass noted. Pulmonary vasculature and the cardiomediastinal silhouette are within normal limits. Gas and stool are seen scattered throughout the colon extending to the level of the distal rectum. No pathologic distension of small bowel is noted. No gross evidence of pneumoperitoneum. IMPRESSION: 1.  No radiographic evidence of acute cardiopulmonary disease. 2. Nonobstructive bowel gas pattern. 3. No pneumoperitoneum. Electronically Signed   By: Trudie Reed M.D.   On: 10/14/2020 07:07   US ABDOMEN LIMITED RUQ (LIVER/GB)  Result  Date: 10/13/2020 CLINICAL DATA:  Abdominal pain EXAM: ULTRASOUND ABDOMEN LIMITED RIGHT UPPER QUADRANT COMPARISON:  None. FINDINGS: Gallbladder: No gallstones or wall thickening visualized. No sonographic Murphy sign noted by sonographer. Common bile duct: Diameter: 3 mm Liver: Echogenic liver with pericholecystic sparing. Portal vein is patent on color Doppler imaging with normal direction of blood flow towards the liver. IMPRESSION: 1. Hepatic steatosis. 2. Negative gallbladder Electronically Signed   By: Marnee Spring M.D.   On: 10/13/2020 04:12    Procedures Procedures   Medications  Ordered in ED Medications  fentaNYL (SUBLIMAZE) injection 50 mcg (has no administration in time range)  ondansetron (ZOFRAN-ODT) disintegrating tablet 8 mg (has no administration in time range)  alum & mag hydroxide-simeth (MAALOX/MYLANTA) 200-200-20 MG/5ML suspension 30 mL (has no administration in time range)    And  lidocaine (XYLOCAINE) 2 % viscous mouth solution 15 mL (has no administration in time range)  dicyclomine (BENTYL) injection 20 mg (has no administration in time range)    ED Course  I have reviewed the triage vital signs and the nursing notes.  Pertinent labs & imaging results that were available during my care of the patient were reviewed by me and considered in my medical decision making (see chart for details).    MDM Rules/Calculators/A&P                          Patient was seen yesterday and earlier today.  Those notes were reviewed.  Labs and imaging reviewed, do not see need to repeat them now.  She has had a very thorough work-up including abdominal ultrasound, abdominal CT, chest x-ray, troponin, D-dimer all of which have come back unremarkable.  Physical exam reassuring.  There is some tenderness in the epigastric area, but no rigidity or guarding consistent with peritoneal signs.  Will initate symptomatic treatment, but doubt any additional imaging or laboratory work-up would be helpful at this point.  Reevaluation: Patient is feeling better.  She is resting comfortably in no acute pain.  Vitals are stable, she is nontoxic-appearing.  She is a very thorough work-up without any indication of emergent process occurring currently.  Discussed return precautions and follow-up.  Patient is agreeable to plan and expresses understanding.  Final Clinical Impression(s) / ED Diagnoses Final diagnoses:  None    Rx / DC Orders ED Discharge Orders     None        Theron Arista, PA-C 10/14/20 1819    Rozelle Logan, DO 10/14/20 2230

## 2020-10-14 NOTE — ED Provider Notes (Signed)
[facility]   CSN: [id number] Arrival date & time: 10/14/20  0405     History Chief Complaint  Patient presents with   Abdominal Pain    Patty Ball is a 40 y.o. female presents to the Emergency Department complaining of gradual, persistent, progressively worsening epigastric abdominal pain.  She reports intermittent episodes of this pain on and off for weeks.  Pain occurs up underneath her ribs.  No aggravating or alleviating factors of the pain.  She denies fever, nausea, vomiting or diarrhea.  It is not specifically associated with the eating.  Pain does not radiate into her chest and she has no shortness of breath.  No syncope or near syncope.  Abdominal surgery includes previous appendectomy.  Records reviewed.  Patient was evaluated 24 hours ago for similar.  At that time she had normal blood work, normal ultrasound and normal CT scan.  Patient reports the pain continues to be severe, but today she complains of associated chest pain, shortness of breath and palpitations.  Reports she does have a history of Graves' disease and takes methimazole and propanolol.  She reports no missed doses of these.    The history is provided by the patient and medical records. No language interpreter was used.      Past Medical History:  Diagnosis Date   Asthma     There are no problems to display for this patient.   Past Surgical History:  Procedure Laterality Date   APPENDECTOMY     TONSILLECTOMY       OB History   No obstetric history on file.     History reviewed. No pertinent family history.  Social History   Tobacco Use   Smoking status: Unknown    Home Medications Prior to Admission medications   Medication Sig Start Date End Date Taking? Authorizing Provider  dicyclomine (BENTYL) 20 MG tablet Take 1 tablet (20 mg total) by mouth 3 (three) times daily before meals. 10/13/20   Gilda Crease, MD   famotidine (PEPCID) 20 MG tablet Take 1 tablet (20 mg total) by mouth 2 (two) times daily. 10/13/20   Gilda Crease, MD  metoprolol tartrate (LOPRESSOR) 25 MG tablet Take 1 tablet (25 mg total) by mouth 2 (two) times daily. 04/21/19   Zadie Rhine, MD    Allergies    Hydrocortisone, Penicillins, Penicillins, and Vitamin k and related  Review of Systems   Review of Systems  Constitutional:  Negative for appetite change, diaphoresis, fatigue, fever and unexpected weight change.  HENT:  Negative for mouth sores.   Eyes:  Negative for visual disturbance.  Respiratory:  Negative for cough, chest tightness, shortness of breath and wheezing.   Cardiovascular:  Negative for chest pain.  Gastrointestinal:  Positive for abdominal pain. Negative for constipation, diarrhea, nausea and vomiting.  Endocrine: Negative for polydipsia, polyphagia and polyuria.  Genitourinary:  Negative for dysuria, frequency, hematuria and urgency.  Musculoskeletal:  Negative for back pain and neck stiffness.  Skin:  Negative for rash.  Allergic/Immunologic: Negative for immunocompromised state.  Neurological:  Negative for syncope, light-headedness and headaches.  Hematological:  Does not bruise/bleed easily.  Psychiatric/Behavioral:  Negative for sleep disturbance. The patient is not nervous/anxious.    Physical Exam Updated Vital Signs BP (!) 146/92 (BP Location: Left Arm)   Pulse 80   Temp 98 F (36.7 C) (Oral)   Resp 18   SpO2 98%   Physical Exam Vitals and nursing note  reviewed.  Constitutional:      General: She is not in acute distress.    Appearance: She is not diaphoretic.  HENT:     Head: Normocephalic.  Eyes:     General: No scleral icterus.    Conjunctiva/sclera: Conjunctivae normal.  Cardiovascular:     Rate and Rhythm: Normal rate and regular rhythm.     Pulses: Normal pulses.          Radial pulses are 2+ on the right side and 2+ on the left side.  Pulmonary:     Effort: No  tachypnea, accessory muscle usage, prolonged expiration, respiratory distress or retractions.     Breath sounds: No stridor.     Comments: Equal chest rise. No increased work of breathing. Abdominal:     General: There is no distension.     Palpations: Abdomen is soft.     Tenderness: There is abdominal tenderness in the epigastric area. There is no right CVA tenderness, left CVA tenderness, guarding or rebound.  Musculoskeletal:     Cervical back: Normal range of motion.     Comments: Moves all extremities equally and without difficulty.  Skin:    General: Skin is warm and dry.     Capillary Refill: Capillary refill takes less than 2 seconds.  Neurological:     Mental Status: She is alert.     GCS: GCS eye subscore is 4. GCS verbal subscore is 5. GCS motor subscore is 6.     Comments: Speech is clear and goal oriented.  Psychiatric:        Mood and Affect: Mood normal.    ED Results / Procedures / Treatments   Labs (all labs ordered are listed, but only abnormal results are displayed) Labs Reviewed  CBC - Abnormal; Notable for the following components:      Result Value   WBC 10.8 (*)    All other components within normal limits  COMPREHENSIVE METABOLIC PANEL - Abnormal; Notable for the following components:   Glucose, Bld 112 (*)    BUN 21 (*)    Total Protein 8.4 (*)    All other components within normal limits  LIPASE, BLOOD  TSH  D-DIMER, QUANTITATIVE  TROPONIN I (HIGH SENSITIVITY)     Radiology CT ABDOMEN PELVIS W CONTRAST  Result Date: 10/13/2020 CLINICAL DATA:  40 year old female with history of acute onset of nonlocalized abdominal pain. Elevated white blood cell count. Microscopic hematuria. EXAM: CT ABDOMEN AND PELVIS WITH CONTRAST TECHNIQUE: Multidetector CT imaging of the abdomen and pelvis was performed using the standard protocol following bolus administration of intravenous contrast. CONTRAST:  OMNIPAQUE IOHEXOL 300 MG/ML  SOLN COMPARISON:  No priors.  FINDINGS: Lower chest: Unremarkable. Hepatobiliary: Diffuse low attenuation throughout the hepatic parenchyma, indicative of a background of hepatic steatosis. No discrete cystic or solid hepatic lesions. No intra or extrahepatic biliary ductal dilatation. Gallbladder is normal in appearance. Pancreas: No pancreatic mass. No pancreatic ductal dilatation. No pancreatic or peripancreatic fluid collections or inflammatory changes. Spleen: Unremarkable. Adrenals/Urinary Tract: Bilateral kidneys and adrenal glands are normal in appearance. No hydroureteronephrosis. Urinary bladder is nearly decompressed, but otherwise unremarkable in appearance. Stomach/Bowel: The appearance of the stomach is normal. There is no pathologic dilatation of small bowel or colon. A few scattered colonic diverticulae are noted, without surrounding inflammatory changes to suggest an acute diverticulitis at this time. Normal appendix. Vascular/Lymphatic: No significant atherosclerotic disease, aneurysm or dissection noted in the abdominal or pelvic vasculature. No lymphadenopathy noted in  the abdomen or pelvis. Reproductive: Uterus and ovaries are unremarkable in appearance. Other: No significant volume of ascites.  No pneumoperitoneum. Musculoskeletal: There are no aggressive appearing lytic or blastic lesions noted in the visualized portions of the skeleton. IMPRESSION: 1. No acute findings are noted in the abdomen or pelvis. 2. Hepatic steatosis. 3. Mild colonic diverticulosis without evidence of acute diverticulitis at this time. Electronically Signed   By: Trudie Reed M.D.   On: 10/13/2020 06:08   US ABDOMEN LIMITED RUQ (LIVER/GB)  Result Date: 10/13/2020 CLINICAL DATA:  Abdominal pain EXAM: ULTRASOUND ABDOMEN LIMITED RIGHT UPPER QUADRANT COMPARISON:  None. FINDINGS: Gallbladder: No gallstones or wall thickening visualized. No sonographic Murphy sign noted by sonographer. Common bile duct: Diameter: 3 mm Liver: Echogenic liver with  pericholecystic sparing. Portal vein is patent on color Doppler imaging with normal direction of blood flow towards the liver. IMPRESSION: 1. Hepatic steatosis. 2. Negative gallbladder Electronically Signed   By: Marnee Spring M.D.   On: 10/13/2020 04:12    Procedures Procedures   Medications Ordered in ED Medications  pantoprazole (PROTONIX) injection 40 mg (40 mg Intravenous Given 10/14/20 0548)  alum & mag hydroxide-simeth (MAALOX/MYLANTA) 200-200-20 MG/5ML suspension 30 mL (30 mLs Oral Given 10/14/20 0540)    And  lidocaine (XYLOCAINE) 2 % viscous mouth solution 15 mL (15 mLs Oral Given 10/14/20 0540)  dicyclomine (BENTYL) capsule 10 mg (10 mg Oral Given 10/14/20 0541)    ED Course  I have reviewed the triage vital signs and the nursing notes.  Pertinent labs & imaging results that were available during my care of the patient were reviewed by me and considered in my medical decision making (see chart for details).    MDM Rules/Calculators/A&P                          Presents to the emergency department with persistent epigastric abdominal pain.  Work-up including imaging was -24 hours ago.  We will recheck labs and attempt additional pain control.  6:26 AM Patient reports feeling better after GI cocktail and Protonix.  Highly suspicious of GERD.  Complete abdominal work-up yesterday however no chest pain work-up.  Will check cardiac enzymes, TSH, D-dimer.  6:35 AM At shift change care was transferred to [facility] who will follow pending studies, re-evaulate and determine disposition.     Final Clinical Impression(s) / ED Diagnoses Final diagnoses:  Epigastric abdominal pain    Rx / DC Orders ED Discharge Orders     None        Jearlene Bridwell, Boyd Kerbs 10/14/20 0636    Molpus, Jonny Ruiz, MD 10/14/20 [id number]

## 2020-10-14 NOTE — Discharge Instructions (Addendum)
You were seen today in the ER abdominal pain.  Reviewed your work-up from earlier today, and yesterday. Although your pain is very real, there is no evidence of a medical emergency based on the lab work and imaging.  I am unable to determine the cause of the pain.  That said, I do want you to follow-up with your primary care doctor in [place].  Please schedule an appointment for with return back to the area.  Please continue to take the medicine prescribed to you today.   Te vieron hoy en urgencias con dolor abdominal. Revis su trabajo de hoy y ayer. Aunque su dolor es [place] real, no hay evidencia de una emergencia mdica basada en el trabajo de laboratorio y las imgenes. No puedo determinar la causa del dolor. Dicho esto, quiero que haga un seguimiento con su mdico de atencin [employer] ge[employer]. Favor de [employer] cita para con regreso a la zona. Contine tomando el medicamento que le recetaron hoy.

## 2020-10-14 NOTE — Discharge Instructions (Signed)
Please read and follow all provided instructions.  Your diagnoses today include:  1. Epigastric abdominal pain     Tests performed today include: Blood cell counts and platelets Kidney and liver function tests Pancreas function test (called lipase) Urine test to look for infection Thyroid test - was normal Screening test for heart attack and blood clot - was normal A blood or urine test for pregnancy (women only) Vital signs. See below for your results today.   Medications prescribed:  Carafate - for stomach upset and to protect your stomach  Omeprazole (Prilosec) - stomach acid reducer  This medication can be found over-the-counter  Take any prescribed medications only as directed.  Home care instructions:  Follow any educational materials contained in this packet.  Follow-up instructions: Please follow-up with your primary care provider in the next 3 days for further evaluation of your symptoms.    Return instructions:  SEEK IMMEDIATE MEDICAL ATTENTION IF: The pain does not go away or becomes severe  A temperature above 101F develops  Repeated vomiting occurs (multiple episodes)  The pain becomes localized to portions of the abdomen. The right side could possibly be appendicitis. In an adult, the left lower portion of the abdomen could be colitis or diverticulitis.  Blood is being passed in stools or vomit (bright red or black tarry stools)  You develop chest pain, difficulty breathing, dizziness or fainting, or become confused, poorly responsive, or inconsolable (young children) If you have any other emergent concerns regarding your health  Additional Information: Abdominal (belly) pain can be caused by many things. Your caregiver performed an examination and possibly ordered blood/urine tests and imaging (CT scan, x-rays, ultrasound). Many cases can be observed and treated at home after initial evaluation in the emergency department. Even though you are being discharged  home, abdominal pain can be unpredictable. Therefore, you need a repeated exam if your pain does not resolve, returns, or worsens. Most patients with abdominal pain don't have to be admitted to the hospital or have surgery, but serious problems like appendicitis and gallbladder attacks can start out as nonspecific pain. Many abdominal conditions cannot be diagnosed in one visit, so follow-up evaluations are very important.  Your vital signs today were: BP 138/88 (BP Location: Right Arm)   Pulse 78   Temp 98 F (36.7 C) (Oral)   Resp 16   SpO2 100%  If your blood pressure (bp) was elevated above 135/85 this visit, please have this repeated by your doctor within one month. --------------

## 2020-10-14 NOTE — ED Triage Notes (Signed)
Pt is now on her 3rd visit to the ED in the last 24hrs , with c/o of cp , back with continued pain

## 2020-10-14 NOTE — ED Provider Notes (Signed)
7:02 AM signout from [place] PA-C at shift change.  Patient with recent negative abdominal work-up.  She continues to have this pain but also reports chest pain.  Currently awaiting D-dimer and troponin for further evaluation.  BP 138/88 (BP Location: Right Arm)   Pulse 78   Temp 98 F (36.7 C) (Oral)   Resp 16   SpO2 100%   7:40 AM remainder of work-up is reassuring.  This includes troponin, D-dimer, and TSH.  Patient updated on results using video interpreter.  She was prescribed Pepcid at last visit.  Will add omeprazole and Carafate.  Will give GI referral.  7:40 AM The patient was urged to return to the Emergency Department immediately with worsening of current symptoms, worsening abdominal pain, persistent vomiting, blood noted in stools, fever, or any other concerns. The patient verbalized understanding.   BP 138/88 (BP Location: Right Arm)   Pulse 78   Temp 98 F (36.7 C) (Oral)   Resp 16   SpO2 100%       Renne Crigler, PA-C 10/14/20 0741    Pollyann Savoy, MD 10/14/20 1324

## 2020-10-14 NOTE — ED Provider Notes (Signed)
Emergency Medicine Provider Triage Evaluation Note  Patty Ball , a 39 y.o. female  was evaluated in triage.  Pt complains of epigastric  Low chest pain.  Patient constantly over the last 3 days.  Patient was seen earlier this morning with complete work-up including CT abdomen pelvis, troponin, D-dimer.  No recent change in her chest pain significantly breath.  Also endorses that she is having trouble sleeping sleeping due to her pain.     Review of Systems  Positive: Chest pain, nausea  Negative: Vomiting, shortness of breath, fever, chills  Physical Exam  BP 136/80   Pulse 88   Temp 98.8 F (37.1 C) (Oral)   Resp 18   SpO2 97%  Gen:   Awake, no distress   Resp:  Normal effort, lungs clear to auscultation bilaterally MSK:   Moves extremities without difficulty  Other:  S1, S2 present with no murmurs rubs or gallops.  Abdomen soft, nondistended, mild tenderness to epigastric area.  Medical Decision Making  Medically screening exam initiated at 12:09 PM.  Appropriate orders placed.  Patty Ball [place] was informed that the remainder of the evaluation will be completed by another provider, this initial triage assessment does not replace that evaluation, and the importance of remaining in the ED until their evaluation is complete.  The patient appears stable so that the remainder of the work up may be completed by another provider.      Haskel Schroeder, PA-C 10/14/20 1220    Arby Barrette, MD 10/15/20 [phone]

## 2020-10-14 NOTE — ED Triage Notes (Signed)
Pt to Ed from home with c/o upper abdominal pain. Pt was seen here last night for the same, states pain hasnt gotten any better. VSS, NADN.

## 2020-10-15 NOTE — ED Notes (Signed)
50 mg fentanyl wasted in stericycle with Timmothy Euler. RN

## 2023-02-17 IMAGING — CR DG ABDOMEN ACUTE W/ 1V CHEST
4 series · 4 of 4 positions shown · non-contrast
Comparison: Chest x-ray 04/20/2019.

CLINICAL DATA: 39-year-old female with history of chest pain and
shortness of breath intermittently for the past several weeks.

EXAM:
DG ABDOMEN ACUTE WITH 1 VIEW CHEST

[w chest pa]
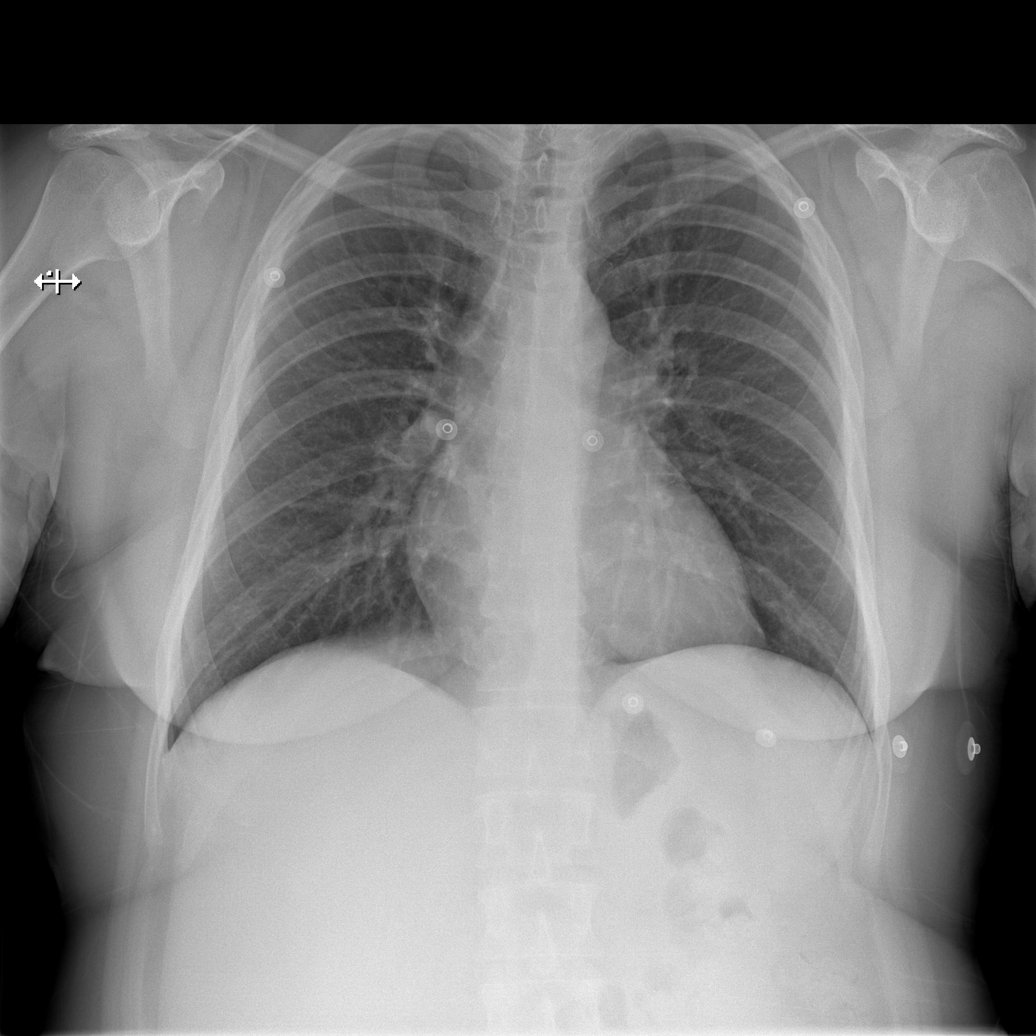

[w abdomen upright]
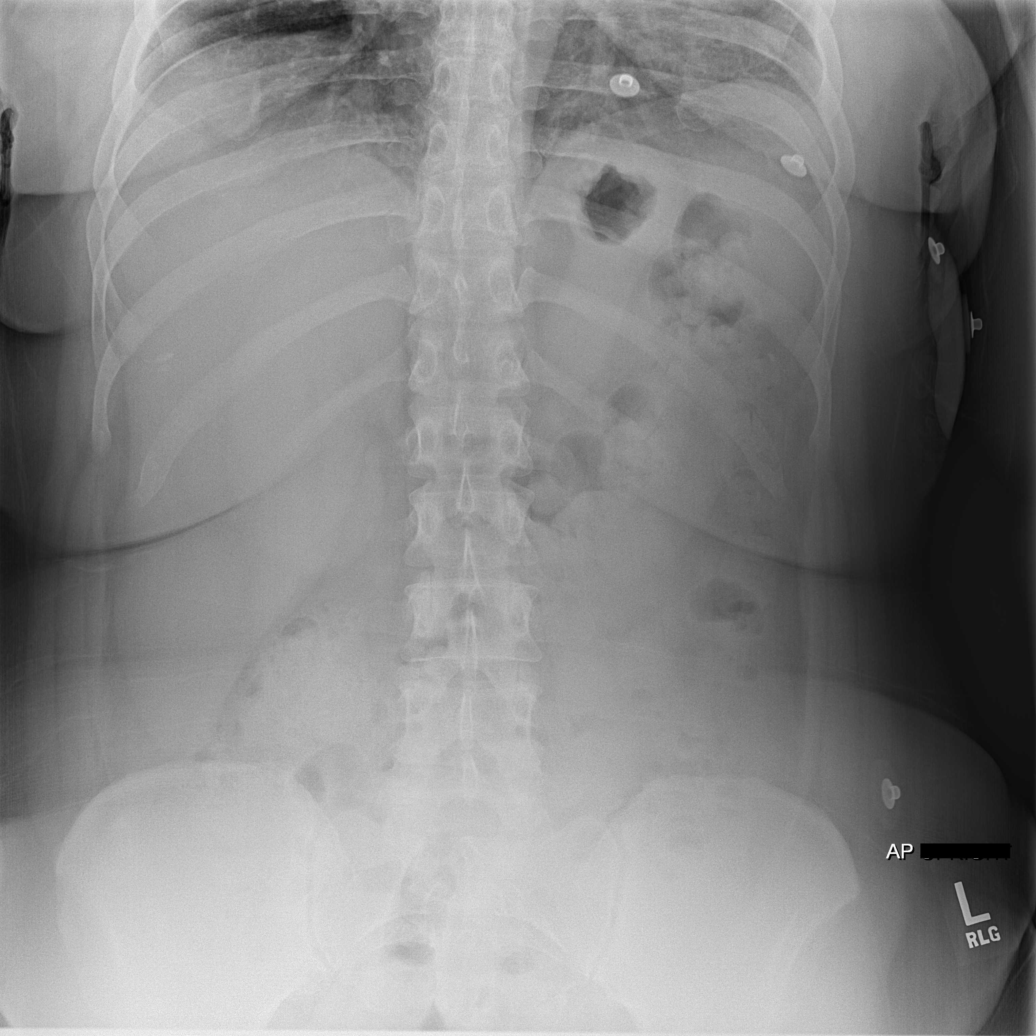

[t abdomen supine (1 of 2)]
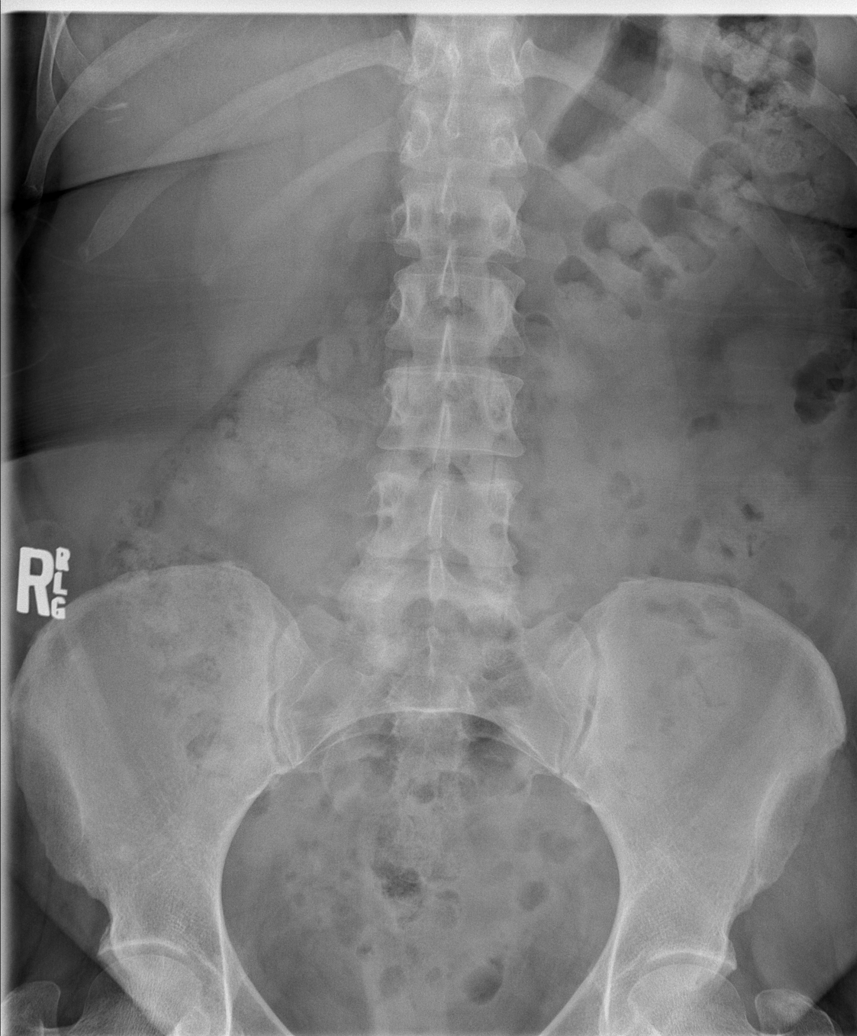

[t abdomen supine (2 of 2)]
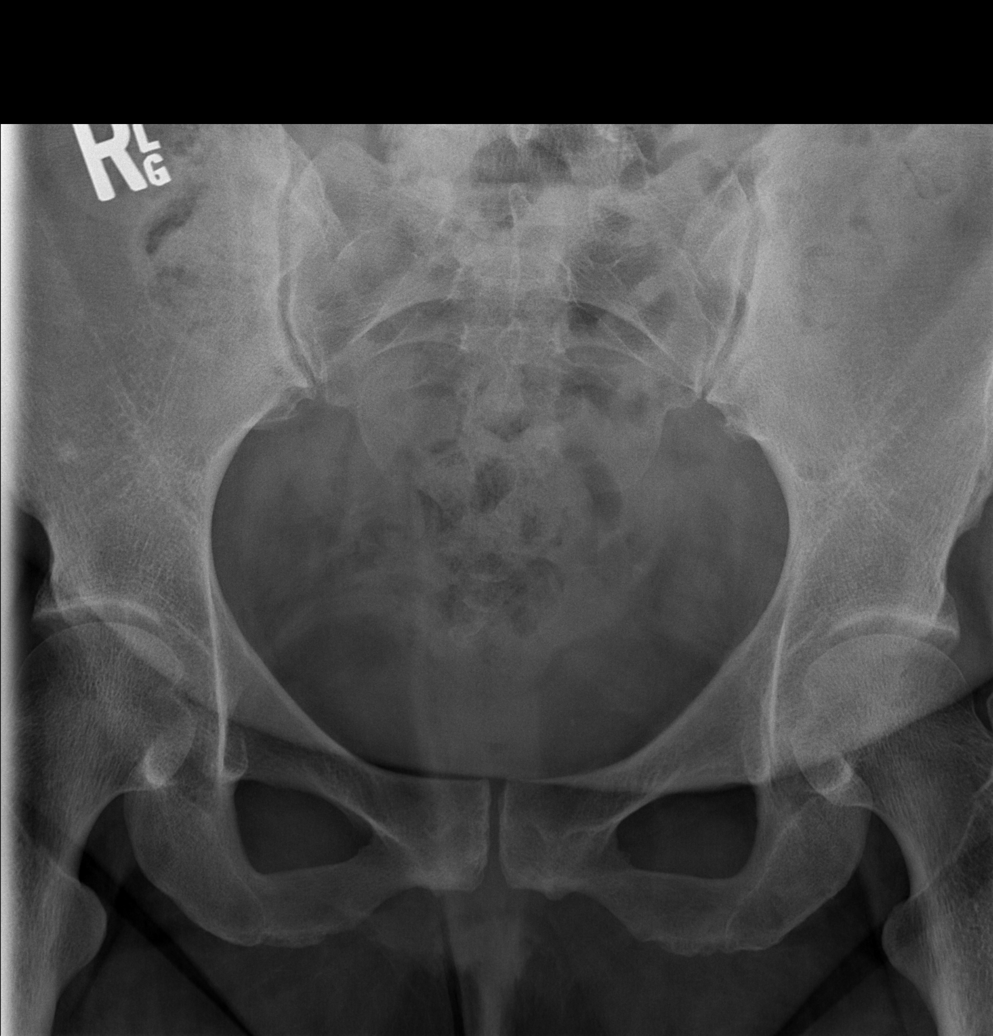

[4 of 4 positions shown; findings below may reference images not displayed]

FINDINGS: Lung volumes are normal. No consolidative airspace disease. No
pleural effusions. No pneumothorax. No pulmonary nodule or mass
noted. Pulmonary vasculature and the cardiomediastinal silhouette
are within normal limits.

Gas and stool are seen scattered throughout the colon extending to
the level of the distal rectum. No pathologic distension of small
bowel is noted. No gross evidence of pneumoperitoneum.
IMPRESSION: 1.  No radiographic evidence of acute cardiopulmonary disease.
2. Nonobstructive bowel gas pattern.
3. No pneumoperitoneum.
